# Patient Record
Sex: Female | Born: 1995
Health system: Southern US, Community
[De-identification: ages and names within clinical notes are randomized; demographics above are authoritative.]

## PROBLEM LIST (undated history)

## (undated) DIAGNOSIS — Z8371 Family history of colonic polyps: Secondary | ICD-10-CM

## (undated) HISTORY — PX: COLON SURGERY: SHX602

---
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
## 2015-05-11 DIAGNOSIS — N760 Acute vaginitis: Secondary | ICD-10-CM | POA: Diagnosis not present

## 2015-07-01 DIAGNOSIS — D481 Neoplasm of uncertain behavior of connective and other soft tissue: Secondary | ICD-10-CM | POA: Diagnosis not present

## 2015-07-25 DIAGNOSIS — K080 Exfoliation of teeth due to systemic causes: Secondary | ICD-10-CM | POA: Diagnosis not present

## 2015-08-04 DIAGNOSIS — K080 Exfoliation of teeth due to systemic causes: Secondary | ICD-10-CM | POA: Diagnosis not present

## 2015-08-16 DIAGNOSIS — D481 Neoplasm of uncertain behavior of connective and other soft tissue: Secondary | ICD-10-CM | POA: Diagnosis not present

## 2015-08-16 DIAGNOSIS — K921 Melena: Secondary | ICD-10-CM | POA: Diagnosis not present

## 2015-08-16 DIAGNOSIS — Z880 Allergy status to penicillin: Secondary | ICD-10-CM | POA: Diagnosis not present

## 2015-08-16 DIAGNOSIS — Z8711 Personal history of peptic ulcer disease: Secondary | ICD-10-CM | POA: Diagnosis not present

## 2015-08-19 DIAGNOSIS — K080 Exfoliation of teeth due to systemic causes: Secondary | ICD-10-CM | POA: Diagnosis not present

## 2015-10-07 IMAGING — US US ABDOMEN COMPLETE
1 series · 14 of 25 positions shown · non-contrast
Comparison: None.

CLINICAL DATA: Abdominal pain, history of prior colectomy

EXAM:
ULTRASOUND ABDOMEN COMPLETE

[Series 1: us abdomen complete · 0.20mm/px · 14 of 80 slices shown]
[im 1/80]
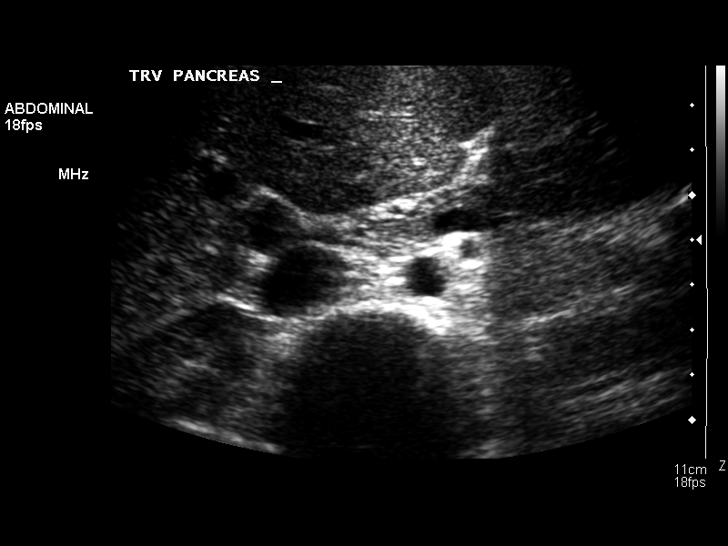
[im 7/80]
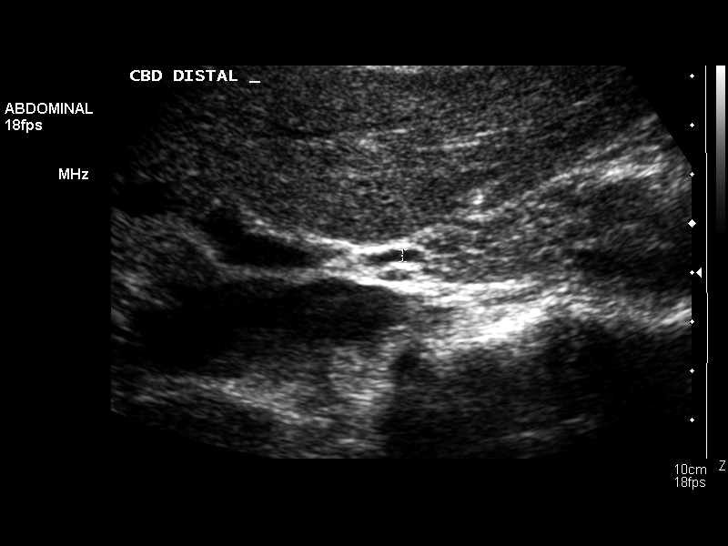
[im 14/80]
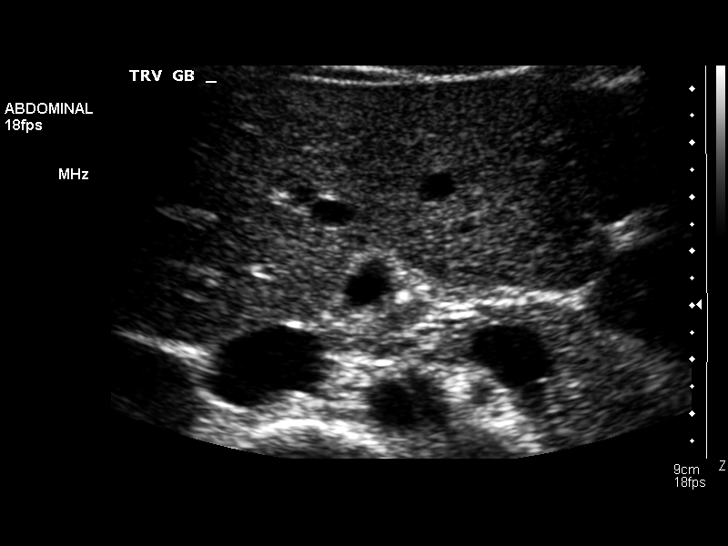
[im 20/80]
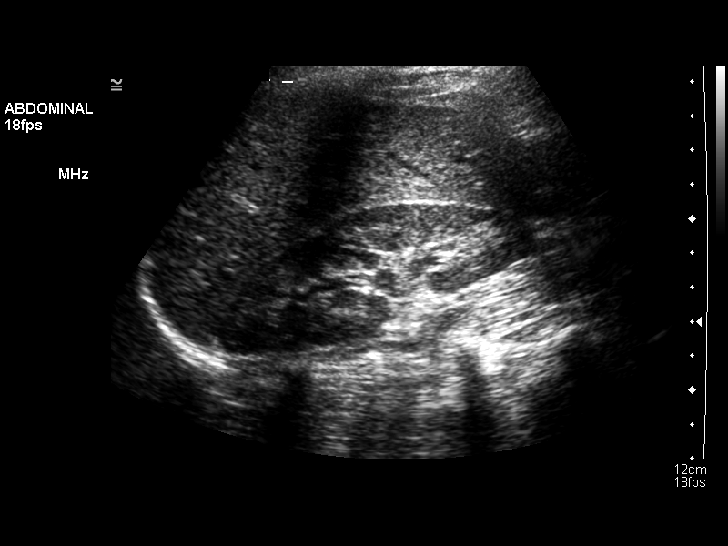
[im 27/80]
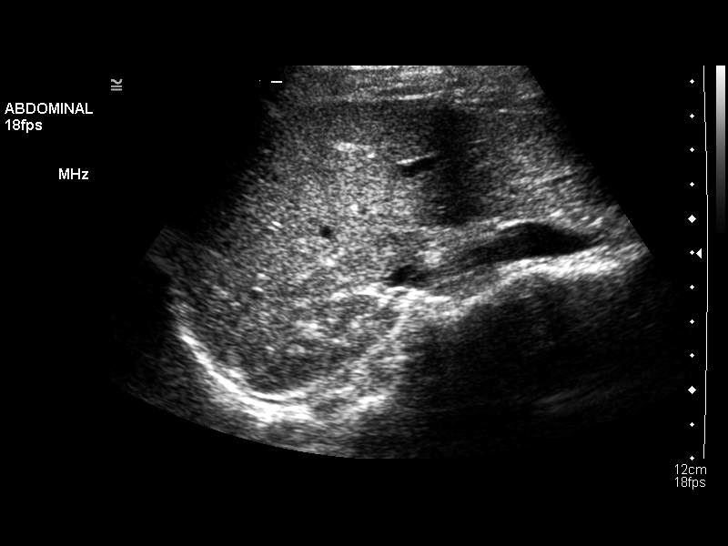
[im 30/80]
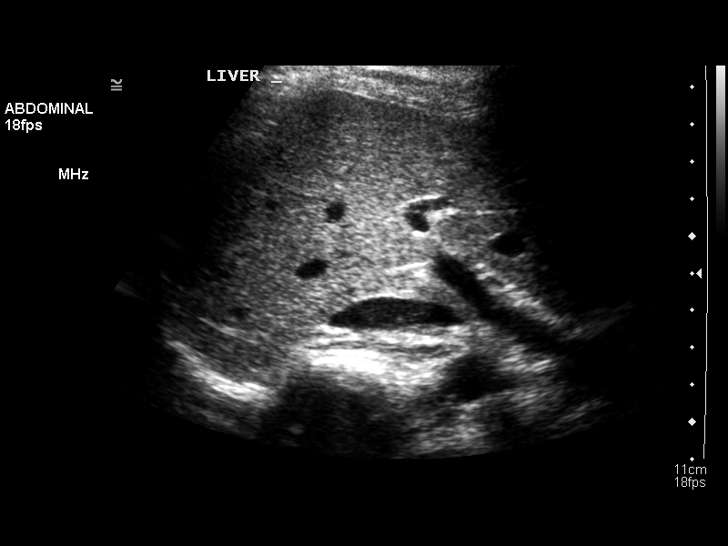
[im 37/80]
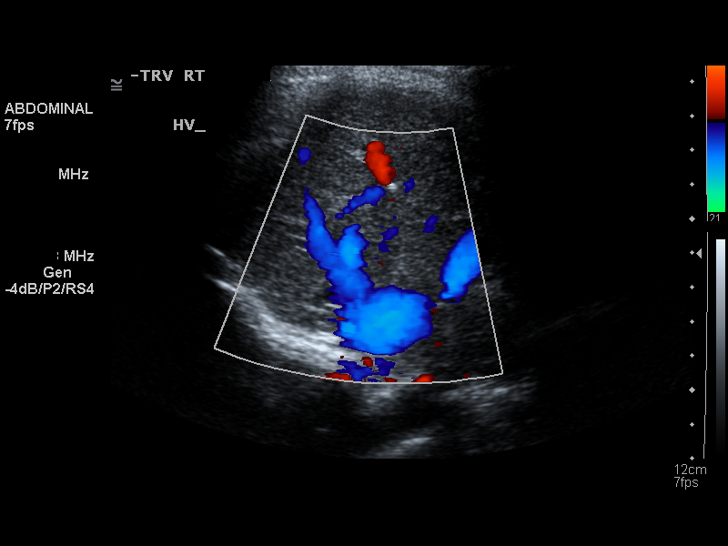
[im 43/80]
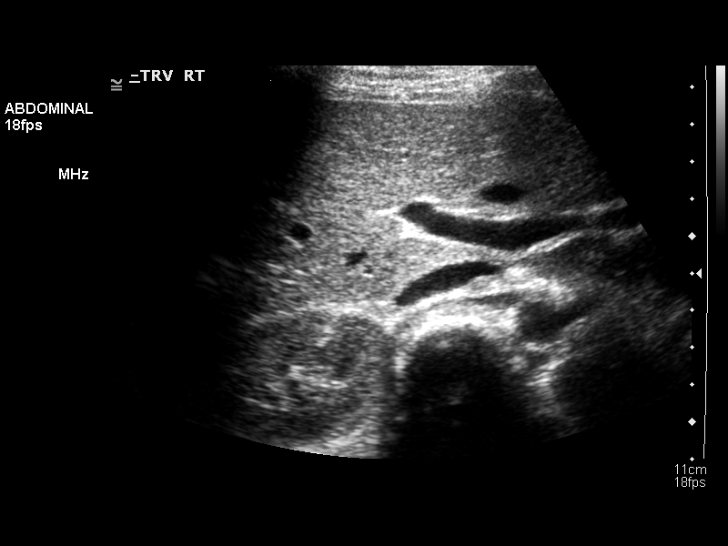
[im 50/80]
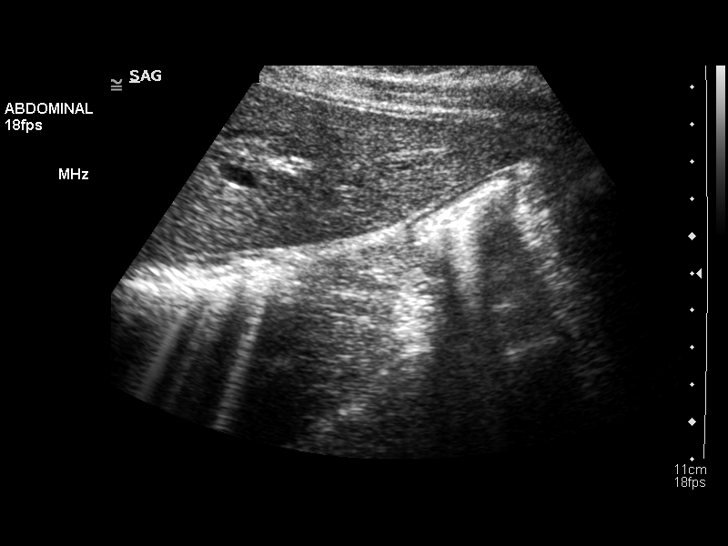
[im 53/80]
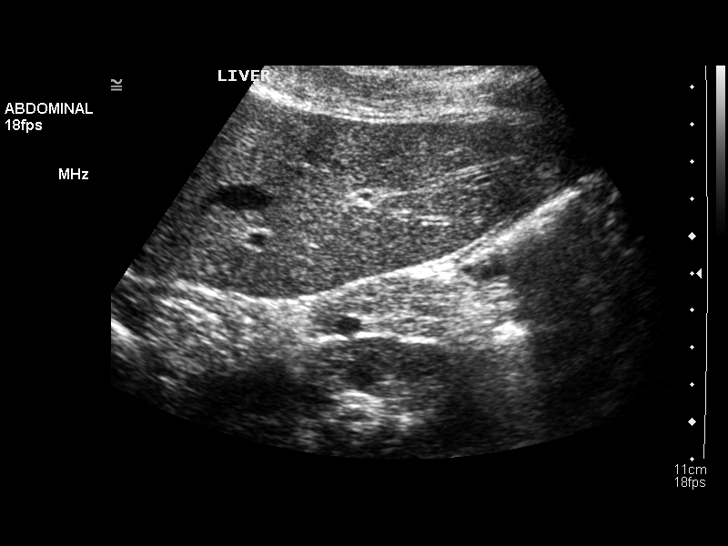
[im 60/80]
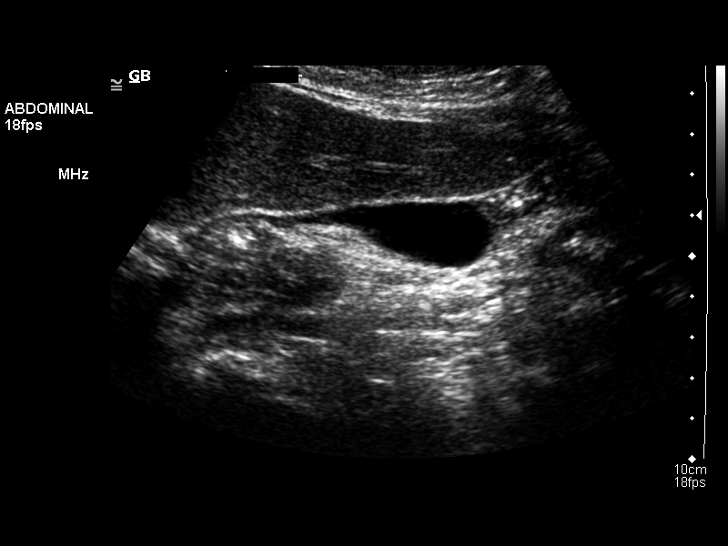
[im 66/80]
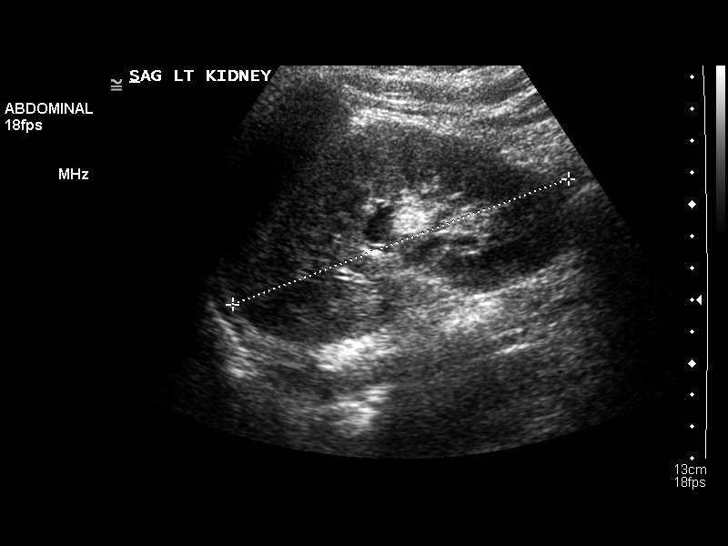
[im 73/80]
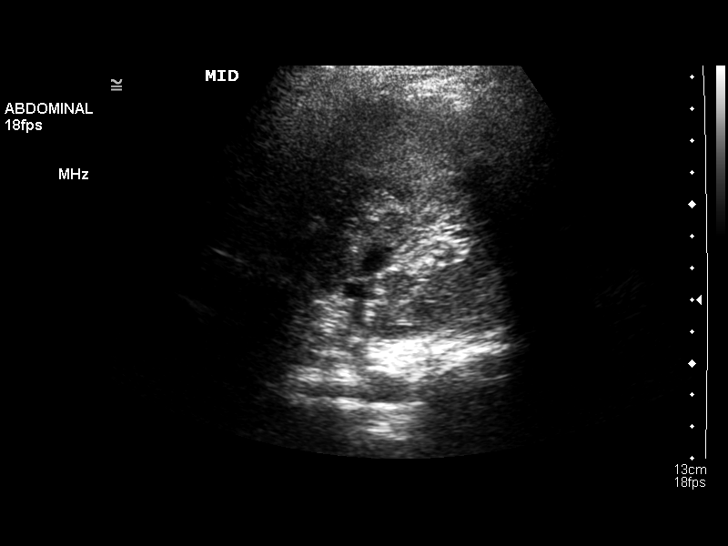
[im 80/80]
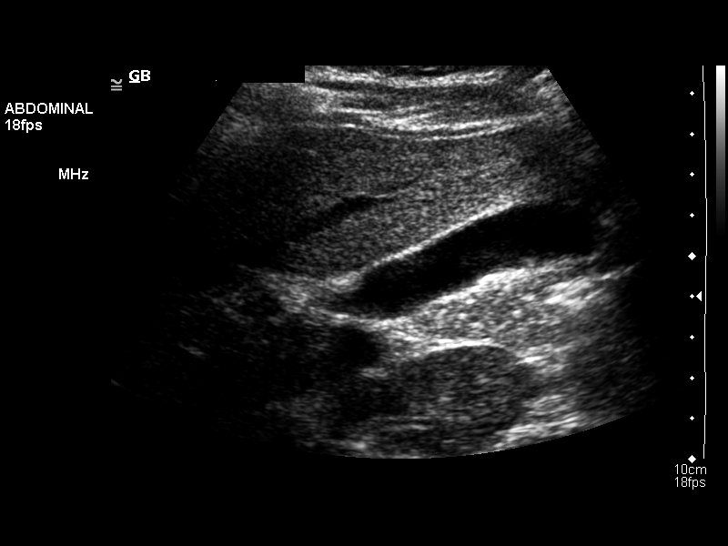

[14 of 25 positions shown; findings below may reference images not displayed]

FINDINGS: Gallbladder

The gallbladder is visualized and no gallstones are noted. There is
no pain over the gallbladder with compression.

Common bile duct

Diameter: The common bile duct is normal measuring 2.4 mm in
diameter distally.

Liver

The liver has a normal echogenic pattern. No focal abnormality is
seen.

IVC

No abnormality is visualized.

Pancreas

Visualized portion is unremarkable.

Spleen

The spleen is normal measuring 6.9 cm sagittally.

Right Kidney

Length: The right kidney measures 10.5 cm sagittally.. No
hydronephrosis is seen.

Left Kidney

Length: The left kidney measures 11.3 cm.. No hydronephrosis is
noted.

Abdominal aorta

The abdominal aorta is normal in caliber.
IMPRESSION: Negative abdominal ultrasound.

## 2015-10-24 DIAGNOSIS — Z6826 Body mass index (BMI) 26.0-26.9, adult: Secondary | ICD-10-CM | POA: Diagnosis not present

## 2015-10-24 DIAGNOSIS — Z01419 Encounter for gynecological examination (general) (routine) without abnormal findings: Secondary | ICD-10-CM | POA: Diagnosis not present

## 2015-11-16 DIAGNOSIS — Z23 Encounter for immunization: Secondary | ICD-10-CM | POA: Diagnosis not present

## 2015-12-07 DIAGNOSIS — Z113 Encounter for screening for infections with a predominantly sexual mode of transmission: Secondary | ICD-10-CM | POA: Diagnosis not present

## 2016-03-29 DIAGNOSIS — Z113 Encounter for screening for infections with a predominantly sexual mode of transmission: Secondary | ICD-10-CM | POA: Diagnosis not present

## 2016-04-03 DIAGNOSIS — M549 Dorsalgia, unspecified: Secondary | ICD-10-CM | POA: Diagnosis not present

## 2016-04-03 DIAGNOSIS — M25562 Pain in left knee: Secondary | ICD-10-CM | POA: Diagnosis not present

## 2016-04-03 DIAGNOSIS — M545 Low back pain: Secondary | ICD-10-CM | POA: Diagnosis not present

## 2016-05-24 DIAGNOSIS — Z862 Personal history of diseases of the blood and blood-forming organs and certain disorders involving the immune mechanism: Secondary | ICD-10-CM | POA: Diagnosis not present

## 2016-05-24 DIAGNOSIS — Z3046 Encounter for surveillance of implantable subdermal contraceptive: Secondary | ICD-10-CM | POA: Diagnosis not present

## 2016-05-24 DIAGNOSIS — Z13220 Encounter for screening for lipoid disorders: Secondary | ICD-10-CM | POA: Diagnosis not present

## 2016-05-24 DIAGNOSIS — Z23 Encounter for immunization: Secondary | ICD-10-CM | POA: Diagnosis not present

## 2016-05-24 DIAGNOSIS — Z131 Encounter for screening for diabetes mellitus: Secondary | ICD-10-CM | POA: Diagnosis not present

## 2016-05-24 DIAGNOSIS — Z0000 Encounter for general adult medical examination without abnormal findings: Secondary | ICD-10-CM | POA: Diagnosis not present

## 2016-05-24 DIAGNOSIS — Z30017 Encounter for initial prescription of implantable subdermal contraceptive: Secondary | ICD-10-CM | POA: Diagnosis not present

## 2016-06-08 DIAGNOSIS — K080 Exfoliation of teeth due to systemic causes: Secondary | ICD-10-CM | POA: Diagnosis not present

## 2016-07-13 DIAGNOSIS — Z23 Encounter for immunization: Secondary | ICD-10-CM | POA: Diagnosis not present

## 2016-08-14 DIAGNOSIS — Z23 Encounter for immunization: Secondary | ICD-10-CM | POA: Diagnosis not present

## 2016-08-21 DIAGNOSIS — H35413 Lattice degeneration of retina, bilateral: Secondary | ICD-10-CM | POA: Diagnosis not present

## 2016-08-21 DIAGNOSIS — Z0100 Encounter for examination of eyes and vision without abnormal findings: Secondary | ICD-10-CM | POA: Diagnosis not present

## 2016-10-12 DIAGNOSIS — D126 Benign neoplasm of colon, unspecified: Secondary | ICD-10-CM | POA: Diagnosis not present

## 2016-10-12 DIAGNOSIS — K314 Gastric diverticulum: Secondary | ICD-10-CM | POA: Diagnosis not present

## 2016-10-12 DIAGNOSIS — K6389 Other specified diseases of intestine: Secondary | ICD-10-CM | POA: Diagnosis not present

## 2016-10-12 DIAGNOSIS — Z09 Encounter for follow-up examination after completed treatment for conditions other than malignant neoplasm: Secondary | ICD-10-CM | POA: Diagnosis not present

## 2016-10-12 DIAGNOSIS — K635 Polyp of colon: Secondary | ICD-10-CM | POA: Diagnosis not present

## 2016-10-19 DIAGNOSIS — J069 Acute upper respiratory infection, unspecified: Secondary | ICD-10-CM | POA: Diagnosis not present

## 2016-11-27 DIAGNOSIS — Z1159 Encounter for screening for other viral diseases: Secondary | ICD-10-CM | POA: Diagnosis not present

## 2016-11-27 DIAGNOSIS — Z01419 Encounter for gynecological examination (general) (routine) without abnormal findings: Secondary | ICD-10-CM | POA: Diagnosis not present

## 2016-11-27 DIAGNOSIS — Z114 Encounter for screening for human immunodeficiency virus [HIV]: Secondary | ICD-10-CM | POA: Diagnosis not present

## 2016-11-27 DIAGNOSIS — Z6826 Body mass index (BMI) 26.0-26.9, adult: Secondary | ICD-10-CM | POA: Diagnosis not present

## 2016-11-27 DIAGNOSIS — Z113 Encounter for screening for infections with a predominantly sexual mode of transmission: Secondary | ICD-10-CM | POA: Diagnosis not present

## 2016-11-30 DIAGNOSIS — Z862 Personal history of diseases of the blood and blood-forming organs and certain disorders involving the immune mechanism: Secondary | ICD-10-CM | POA: Diagnosis not present

## 2016-11-30 DIAGNOSIS — E785 Hyperlipidemia, unspecified: Secondary | ICD-10-CM | POA: Diagnosis not present

## 2016-12-01 DIAGNOSIS — Z111 Encounter for screening for respiratory tuberculosis: Secondary | ICD-10-CM | POA: Diagnosis not present

## 2016-12-01 DIAGNOSIS — Z0000 Encounter for general adult medical examination without abnormal findings: Secondary | ICD-10-CM | POA: Diagnosis not present

## 2016-12-27 DIAGNOSIS — K080 Exfoliation of teeth due to systemic causes: Secondary | ICD-10-CM | POA: Diagnosis not present

## 2017-01-18 ENCOUNTER — Encounter (HOSPITAL_COMMUNITY): Payer: Self-pay

## 2017-01-18 ENCOUNTER — Other Ambulatory Visit: Payer: Self-pay

## 2017-01-18 DIAGNOSIS — Z79899 Other long term (current) drug therapy: Secondary | ICD-10-CM

## 2017-01-18 DIAGNOSIS — R141 Gas pain: Secondary | ICD-10-CM

## 2017-01-18 DIAGNOSIS — R140 Abdominal distension (gaseous): Secondary | ICD-10-CM | POA: Diagnosis not present

## 2017-01-18 DIAGNOSIS — R1012 Left upper quadrant pain: Secondary | ICD-10-CM | POA: Insufficient documentation

## 2017-01-18 DIAGNOSIS — R112 Nausea with vomiting, unspecified: Secondary | ICD-10-CM | POA: Insufficient documentation

## 2017-01-18 DIAGNOSIS — R1013 Epigastric pain: Secondary | ICD-10-CM | POA: Diagnosis not present

## 2017-01-18 LAB — CBC
HEMATOCRIT: 41 % (ref 36.0–46.0)
Hemoglobin: 13.3 g/dL (ref 12.0–15.0)
MCH: 26 pg (ref 26.0–34.0)
MCHC: 32.4 g/dL (ref 30.0–36.0)
MCV: 80.1 fL (ref 78.0–100.0)
PLATELETS: 296 10*3/uL (ref 150–400)
RBC: 5.12 MIL/uL — AB (ref 3.87–5.11)
RDW: 13.2 % (ref 11.5–15.5)
WBC: 7.3 10*3/uL (ref 4.0–10.5)

## 2017-01-18 LAB — COMPREHENSIVE METABOLIC PANEL
ALT: 21 U/L (ref 14–54)
AST: 23 U/L (ref 15–41)
Albumin: 4.4 g/dL (ref 3.5–5.0)
Alkaline Phosphatase: 86 U/L (ref 38–126)
Anion gap: 10 (ref 5–15)
BILIRUBIN TOTAL: 0.7 mg/dL (ref 0.3–1.2)
BUN: 9 mg/dL (ref 6–20)
CO2: 24 mmol/L (ref 22–32)
Calcium: 9.5 mg/dL (ref 8.9–10.3)
Chloride: 101 mmol/L (ref 101–111)
Creatinine, Ser: 0.69 mg/dL (ref 0.44–1.00)
GFR calc Af Amer: >60 mL/min (ref >60)
GFR calc non Af Amer: >60 mL/min (ref >60)
Glucose, Bld: 110 mg/dL — ABNORMAL HIGH (ref 65–99)
Potassium: 4.1 mmol/L (ref 3.5–5.1)
Sodium: 135 mmol/L (ref 135–145)
TOTAL PROTEIN: 8.3 g/dL — AB (ref 6.5–8.1)

## 2017-01-18 LAB — URINALYSIS, ROUTINE W REFLEX MICROSCOPIC
Bilirubin Urine: NEGATIVE
GLUCOSE, UA: NEGATIVE mg/dL
Ketones, ur: NEGATIVE mg/dL
LEUKOCYTES UA: NEGATIVE
NITRITE: NEGATIVE
Protein, ur: 30 mg/dL — AB
SPECIFIC GRAVITY, URINE: 1.021 (ref 1.005–1.030)
pH: 7 (ref 5.0–8.0)

## 2017-01-18 LAB — I-STAT BETA HCG BLOOD, ED (MC, WL, AP ONLY): I-stat hCG, quantitative: <5 m[IU]/mL (ref <5)

## 2017-01-18 LAB — LIPASE, BLOOD: Lipase: 38 U/L (ref 11–51)

## 2017-01-18 MED ORDER — IBUPROFEN 400 MG PO TABS
400.0000 mg | ORAL_TABLET | Freq: Once | ORAL | Status: AC | PRN
  Administered 2017-01-18: 400 mg via ORAL
  Filled 2017-01-18: qty 1

## 2017-01-18 NOTE — ED Triage Notes (Signed)
Pt requesting something for pain.  

## 2017-01-18 NOTE — ED Triage Notes (Signed)
Pt states that for the past two hours has had LUQ abd pain , denies n/v/d/fevers. Pt took some gas x with some relief

## 2017-01-19 ENCOUNTER — Emergency Department (HOSPITAL_BASED_OUTPATIENT_CLINIC_OR_DEPARTMENT_OTHER): Payer: Federal, State, Local not specified - PPO

## 2017-01-19 ENCOUNTER — Emergency Department (HOSPITAL_BASED_OUTPATIENT_CLINIC_OR_DEPARTMENT_OTHER)
Admission: EM | Admit: 2017-01-19 | Discharge: 2017-01-19 | Disposition: A | Discharge status: Home / Self Care | Payer: Federal, State, Local not specified - PPO | Attending: Emergency Medicine | Admitting: Emergency Medicine

## 2017-01-19 ENCOUNTER — Other Ambulatory Visit: Payer: Self-pay

## 2017-01-19 ENCOUNTER — Encounter (HOSPITAL_BASED_OUTPATIENT_CLINIC_OR_DEPARTMENT_OTHER): Payer: Self-pay | Admitting: Emergency Medicine

## 2017-01-19 ENCOUNTER — Emergency Department (HOSPITAL_COMMUNITY)
Admission: EM | Admit: 2017-01-19 | Discharge: 2017-01-19 | Disposition: A | Discharge status: Home / Self Care | Payer: Federal, State, Local not specified - PPO | Source: Home / Self Care | Attending: Emergency Medicine | Admitting: Emergency Medicine

## 2017-01-19 DIAGNOSIS — R1013 Epigastric pain: Secondary | ICD-10-CM | POA: Diagnosis not present

## 2017-01-19 DIAGNOSIS — R1012 Left upper quadrant pain: Secondary | ICD-10-CM

## 2017-01-19 DIAGNOSIS — R141 Gas pain: Secondary | ICD-10-CM

## 2017-01-19 DIAGNOSIS — R112 Nausea with vomiting, unspecified: Secondary | ICD-10-CM | POA: Diagnosis not present

## 2017-01-19 LAB — CBC WITH DIFFERENTIAL/PLATELET
BASOS ABS: 0 10*3/uL (ref 0.0–0.1)
BASOS PCT: 0 %
Eosinophils Absolute: 0 10*3/uL (ref 0.0–0.7)
Eosinophils Relative: 0 %
HCT: 38.3 % (ref 36.0–46.0)
HEMOGLOBIN: 12.7 g/dL (ref 12.0–15.0)
Lymphocytes Relative: 19 %
Lymphs Abs: 1.4 10*3/uL (ref 0.7–4.0)
MCH: 26.1 pg (ref 26.0–34.0)
MCHC: 33.2 g/dL (ref 30.0–36.0)
MCV: 78.8 fL (ref 78.0–100.0)
Monocytes Absolute: 0.6 10*3/uL (ref 0.1–1.0)
Monocytes Relative: 8 %
Neutro Abs: 5.1 10*3/uL (ref 1.7–7.7)
Neutrophils Relative %: 73 %
Platelets: 271 10*3/uL (ref 150–400)
RBC: 4.86 MIL/uL (ref 3.87–5.11)
RDW: 13.3 % (ref 11.5–15.5)
WBC: 7.1 10*3/uL (ref 4.0–10.5)

## 2017-01-19 LAB — COMPREHENSIVE METABOLIC PANEL
ALK PHOS: 82 U/L (ref 38–126)
ALT: 19 U/L (ref 14–54)
AST: 21 U/L (ref 15–41)
Albumin: 4.3 g/dL (ref 3.5–5.0)
Anion gap: 8 (ref 5–15)
BUN: 10 mg/dL (ref 6–20)
CALCIUM: 9.3 mg/dL (ref 8.9–10.3)
CO2: 25 mmol/L (ref 22–32)
CREATININE: 0.78 mg/dL (ref 0.44–1.00)
Chloride: 101 mmol/L (ref 101–111)
Glucose, Bld: 96 mg/dL (ref 65–99)
Potassium: 4 mmol/L (ref 3.5–5.1)
Sodium: 134 mmol/L — ABNORMAL LOW (ref 135–145)
TOTAL PROTEIN: 8.1 g/dL (ref 6.5–8.1)
Total Bilirubin: 0.6 mg/dL (ref 0.3–1.2)

## 2017-01-19 LAB — LIPASE, BLOOD: Lipase: 28 U/L (ref 11–51)

## 2017-01-19 MED ORDER — SUCRALFATE 1 GM/10ML PO SUSP: 1.0000 g | Freq: Three times a day (TID) | ORAL | 0 refills | Status: AC

## 2017-01-19 MED ORDER — DICYCLOMINE HCL 20 MG PO TABS: 20.0000 mg | ORAL_TABLET | Freq: Two times a day (BID) | ORAL | 0 refills | Status: AC

## 2017-01-19 MED ORDER — OMEPRAZOLE 20 MG PO CPDR: 20.0000 mg | DELAYED_RELEASE_CAPSULE | Freq: Every day | ORAL | 0 refills | Status: AC

## 2017-01-19 MED ORDER — ONDANSETRON HCL 4 MG/2ML IJ SOLN
4.0000 mg | Freq: Once | INTRAMUSCULAR | Status: AC
  Administered 2017-01-19: 4 mg via INTRAVENOUS
  Filled 2017-01-19: qty 2

## 2017-01-19 MED ORDER — ONDANSETRON 8 MG PO TBDP: 4.0000 mg | ORAL_TABLET | Freq: Three times a day (TID) | ORAL | 0 refills | Status: DC | PRN

## 2017-01-19 MED ORDER — SODIUM CHLORIDE 0.9 % IV BOLUS (SEPSIS)
1000.0000 mL | Freq: Once | INTRAVENOUS | Status: AC
  Administered 2017-01-19: 1000 mL via INTRAVENOUS

## 2017-01-19 MED ORDER — FAMOTIDINE IN NACL 20-0.9 MG/50ML-% IV SOLN
20.0000 mg | Freq: Once | INTRAVENOUS | Status: AC
  Administered 2017-01-19: 20 mg via INTRAVENOUS
  Filled 2017-01-19: qty 50

## 2017-01-19 MED ORDER — PANTOPRAZOLE SODIUM 40 MG IV SOLR
40.0000 mg | Freq: Once | INTRAVENOUS | Status: AC
  Administered 2017-01-19: 40 mg via INTRAVENOUS
  Filled 2017-01-19: qty 40

## 2017-01-19 MED ORDER — ONDANSETRON 4 MG PO TBDP: 4.0000 mg | ORAL_TABLET | Freq: Three times a day (TID) | ORAL | 0 refills | Status: AC | PRN

## 2017-01-19 MED ORDER — OMEPRAZOLE 20 MG PO CPDR: 20.0000 mg | DELAYED_RELEASE_CAPSULE | Freq: Every day | ORAL | 0 refills | Status: DC

## 2017-01-19 MED ORDER — DICYCLOMINE HCL 10 MG/ML IM SOLN
20.0000 mg | Freq: Once | INTRAMUSCULAR | Status: AC
  Administered 2017-01-19: 20 mg via INTRAMUSCULAR
  Filled 2017-01-19: qty 2

## 2017-01-19 NOTE — Discharge Instructions (Signed)
Can continue gas-x as needed.  May also with to try carbonated beverage (like soda) and moving around to try to help as well. Follow-up with your primary care doctor. Can return here for any new/acute changes.

## 2017-01-19 NOTE — ED Provider Notes (Signed)
[facility] EMERGENCY DEPARTMENT Provider Note   CSN: [id number] Arrival date & time: 01/18/17  2213     History   Chief Complaint Chief Complaint  Patient presents with  . Abdominal Pain    HPI Lisa Duffy is a 22 y.o. female.  The history is provided by the patient and medical records.  Abdominal Pain       22 year old female with no significant past medical history presenting to the ED with left upper abdominal pain.  Reports this began prior to eating dinner this evening around 8 PM.  States pain was sharp in nature, almost like a bloating sensation as well.  States when she ate her pain worsen.  She did take some Gas-X which seemed to help her pain but still remains somewhat severe so she wanted to be evaluated.  She has not had any nausea, vomiting, or diarrhea.  No fever or chills.  No recent illness.  She did not try any other medications for her symptoms.  Past Medical History:  Diagnosis Date  . Family history of FAP (familial adenomatous polyposis)     There are no active problems to display for this patient.   Past Surgical History:  Procedure Laterality Date  . COLON SURGERY      OB History    No data available       Home Medications    Prior to Admission medications   Medication Sig Start Date End Date Taking? Authorizing Provider  BIOTIN PO Take 1 tablet by mouth daily.    [provider]  docusate sodium (COLACE) 100 MG capsule Take 1 capsule (100 mg total) by mouth every 12 (twelve) hours. 12/11/14   Azalia Bilis, MD  etonogestrel (NEXPLANON) 68 MG IMPL implant 1 each by Subdermal route once.    [provider]  ferrous fumarate (HEMOCYTE - 106 MG FE) 325 (106 FE) MG TABS tablet Take 1 tablet by mouth every other day.    [provider]  HYDROcodone-acetaminophen (NORCO/VICODIN) 5-325 MG tablet Take 1 tablet by mouth every 4 (four) hours as needed for moderate pain. 12/11/14   Azalia Bilis, MD    promethazine (PHENERGAN) 25 MG tablet Take 25 mg by mouth every 6 (six) hours as needed for nausea or vomiting.    [provider]    Family History No family history on file.  Social History Social History   Tobacco Use  . Smoking status: Never Smoker  . Smokeless tobacco: Never Used  Substance Use Topics  . Alcohol use: No  . Drug use: No     Allergies   Amoxicillin and Penicillins   Review of Systems Review of Systems  Gastrointestinal: Positive for abdominal pain.  All other systems reviewed and are negative.    Physical Exam Updated Vital Signs BP (!) 120/50 (BP Location: Right Arm)   Pulse 77   Temp 98.8 F (37.1 C) (Oral)   Resp 18   SpO2 96%   Physical Exam  Constitutional: She is oriented to person, place, and time. She appears well-developed and well-nourished.  Sleeping soundly on stretcher, had to be awoken for exam  HENT:  Head: Normocephalic and atraumatic.  Mouth/Throat: Oropharynx is clear and moist.  Eyes: Conjunctivae and EOM are normal. Pupils are equal, round, and reactive to light.  Neck: Normal range of motion.  Cardiovascular: Normal rate, regular rhythm and normal heart sounds.  Pulmonary/Chest: Effort normal and breath sounds normal.  Abdominal: Soft. Bowel sounds are normal.  There is no tenderness. There is no rebound.  Soft, nontender, normal bowel sounds  Musculoskeletal: Normal range of motion.  Neurological: She is alert and oriented to person, place, and time.  Skin: Skin is warm and dry.  Psychiatric: She has a normal mood and affect.  Nursing note and vitals reviewed.    ED Treatments / Results  Labs (all labs ordered are listed, but only abnormal results are displayed) Labs Reviewed  COMPREHENSIVE METABOLIC PANEL - Abnormal; Notable for the following components:      Result Value   Glucose, Bld 110 (*)    Total Protein 8.3 (*)    All other components within normal limits  CBC - Abnormal; Notable for the  following components:   RBC 5.12 (*)    All other components within normal limits  URINALYSIS, ROUTINE W REFLEX MICROSCOPIC - Abnormal; Notable for the following components:   APPearance HAZY (*)    Hgb urine dipstick SMALL (*)    Protein, ur 30 (*)    Bacteria, UA RARE (*)    Squamous Epithelial / LPF 0-5 (*)    All other components within normal limits  LIPASE, BLOOD  I-STAT BETA HCG BLOOD, ED ([facility], [facility], [facility] ONLY)    EKG  EKG Interpretation None       Radiology No results found.  Procedures Procedures (including critical care time)  Medications Ordered in ED Medications  ibuprofen (ADVIL,MOTRIN) tablet 400 mg (400 mg Oral Given 01/18/17 2305)     Initial Impression / Assessment and Plan / ED Course  I have reviewed the triage vital signs and the nursing notes.  Pertinent labs & imaging results that were available during my care of the patient were reviewed by me and considered in my medical decision making (see chart for details).  22 year old female here with left upper abdominal pain.  Seems to have subsided at this time as she is sleeping soundly on the exam stretcher.  Her abdomen is soft and benign.  Screening labs overall reassuring.  Suspect she may have been suffering from some gas pain.  Recommended to continue Gas-X as needed.  May also try carbonated beverage and walking around to help as well.  Can follow-up with PCP.  Discussed plan with patient, she acknowledged understanding and agreed with plan of care.  Return precautions given for new or worsening symptoms.  Final Clinical Impressions(s) / ED Diagnoses   Final diagnoses:  Gas pain    ED Discharge Orders    None       Garlon HatchetSanders, Kinney Sackmann M, PA-C 01/19/17 0440    Gilda CreasePollina, Christopher J, MD 01/19/17 [phone]

## 2017-01-19 NOTE — Discharge Instructions (Signed)
Your abdominal pain is likely from gastritis, reflux or a stomach ulcer. You will need to take the prescribed proton pump inhibitor as directed, and avoid spicy/fatty/acidic foods. Avoid laying down flat within 30 minutes of eating. Avoid NSAIDs like ibuprofen or Aleve on an empty stomach. Use zofran as needed for nausea. Follow up with the gastroenterologist (GI doctor) listed for ongoing evaluation of your abdominal pain. Return to the ER for new or worsening symptoms, any additional concers. ° ° °SEEK IMMEDIATE MEDICAL ATTENTION IF YOU DEVELOP ANY OF THE FOLLOWING SYMPTOMS: °The pain does not go away or becomes severe.  °A temperature above 101 develops.  °Repeated vomiting occurs (multiple episodes).  °Blood is being passed in stools or vomit (bright red or black tarry stools).  °Return also if you develop chest pain, difficulty breathing, dizziness or fainting ° °

## 2017-01-19 NOTE — ED Notes (Signed)
Patient transported to Ultrasound 

## 2017-01-19 NOTE — ED Triage Notes (Signed)
Pt c/o abd pain since yesterday, seen at [facility], vomiting started today.

## 2017-01-19 NOTE — ED Provider Notes (Signed)
[facility] EMERGENCY DEPARTMENT Provider Note   CSN: [id number] Arrival date & time: 01/19/17  1041     History   Chief Complaint Chief Complaint  Patient presents with  . Abdominal Pain    HPI Lisa Duffy is a 22 y.o. female.  HPI 22 year old African American female past medical history significant for familial adenomatosis polyposis who has a history of a partial colectomy 5 years ago that presents to the emergency department today with complaints of left upper quadrant abdominal pain.  The patient was seen in ED yesterday for the same.  Patient states that the pain began prior to eating dinner yesterday evening around 8 PM.  She reports the pain was sharp in nature and feels like a bloating sensation.  She states that when she ate her pain worsen.  Patient thought that it may be some gas and took a Gas-X without any relief.  Patient was seen in the ED and was diagnosed with gas pains total follow-up with primary care doctor.  The patient states the day the pain has continued intermittently but she also reports 3 episodes of emesis today with associated nausea.  Patient denies any change in bowel habits.  Food does not make the pain worse today.  She describes the pain as a burning sensation in her left upper quadrant.  Patient denies any associated urinary symptoms, vaginal symptoms, fevers, chills.  She does report recent travel approximately 1 month ago to [place].  Denies any recent antibiotic use.  Patient denies any bloody stools.  Nothing makes her symptoms better or worse.  Pt denies any fever, chill, ha, vision changes, lightheadedness, dizziness, congestion, neck pain, cp, sob, cough, urinary symptoms, change in bowel habits, melena, hematochezia, lower extremity paresthesias, vaginal bleeding or discharge.  Past Medical History:  Diagnosis Date  . Family history of FAP (familial adenomatous polyposis)     There are no active problems to display for this  patient.   Past Surgical History:  Procedure Laterality Date  . COLON SURGERY      OB History    No data available       Home Medications    Prior to Admission medications   Medication Sig Start Date End Date Taking? Authorizing Provider  BIOTIN PO Take 1 tablet by mouth daily.    [provider]  dicyclomine (BENTYL) 20 MG tablet Take 1 tablet (20 mg total) by mouth 2 (two) times daily. 01/19/17   Rise Mu, PA-C  docusate sodium (COLACE) 100 MG capsule Take 1 capsule (100 mg total) by mouth every 12 (twelve) hours. 12/11/14   Azalia Bilis, MD  etonogestrel (NEXPLANON) 68 MG IMPL implant 1 each by Subdermal route once.    [provider]  ferrous fumarate (HEMOCYTE - 106 MG FE) 325 (106 FE) MG TABS tablet Take 1 tablet by mouth every other day.    [provider]  HYDROcodone-acetaminophen (NORCO/VICODIN) 5-325 MG tablet Take 1 tablet by mouth every 4 (four) hours as needed for moderate pain. 12/11/14   Azalia Bilis, MD  omeprazole (PRILOSEC) 20 MG capsule Take 1 capsule (20 mg total) by mouth daily. 01/19/17   Demetrios Loll T, PA-C  ondansetron (ZOFRAN-ODT) 4 MG disintegrating tablet Take 1 tablet (4 mg total) by mouth every 8 (eight) hours as needed for nausea. 01/19/17   Rise Mu, PA-C  promethazine (PHENERGAN) 25 MG tablet Take 25 mg by mouth every 6 (six) hours as needed for nausea or vomiting.  [provider]  sucralfate (CARAFATE) 1 GM/10ML suspension Take 10 mLs (1 g total) by mouth 4 (four) times daily -  with meals and at bedtime. 01/19/17   Rise MuLeaphart, Kenneth T, PA-C    Family History No family history on file.  Social History Social History   Tobacco Use  . Smoking status: Never Smoker  . Smokeless tobacco: Never Used  Substance Use Topics  . Alcohol use: No  . Drug use: No     Allergies   Amoxicillin and Penicillins   Review of Systems Review of Systems  Constitutional: Negative for chills and  fever.  HENT: Negative for congestion.   Eyes: Negative for visual disturbance.  Respiratory: Negative for cough and shortness of breath.   Cardiovascular: Negative for chest pain.  Gastrointestinal: Positive for abdominal pain, nausea and vomiting. Negative for diarrhea.  Genitourinary: Negative for dysuria, flank pain, frequency, hematuria, urgency, vaginal bleeding and vaginal discharge.  Musculoskeletal: Negative for arthralgias and myalgias.  Skin: Negative for rash.  Neurological: Negative for dizziness, syncope, weakness, light-headedness, numbness and headaches.  Psychiatric/Behavioral: Negative for sleep disturbance. The patient is not nervous/anxious.      Physical Exam Updated Vital Signs BP 116/67 (BP Location: Left Arm)   Pulse 68   Temp 98.3 F (36.8 C) (Oral)   Resp 16   Ht 5\' 3"  (1.6 m)   Wt 64.4 kg (142 lb)   LMP 01/15/2017   SpO2 100%   BMI 25.15 kg/m   Physical Exam  Constitutional: She is oriented to person, place, and time. She appears well-developed and well-nourished.  Non-toxic appearance. No distress.  HENT:  Head: Normocephalic and atraumatic.  Nose: Nose normal.  Mouth/Throat: Oropharynx is clear and moist.  Eyes: Conjunctivae are normal. Pupils are equal, round, and reactive to light. Right eye exhibits no discharge. Left eye exhibits no discharge.  Neck: Normal range of motion. Neck supple.  Cardiovascular: Normal rate, regular rhythm, normal heart sounds and intact distal pulses.  Pulmonary/Chest: Effort normal and breath sounds normal. No stridor. No respiratory distress. She has no wheezes. She has no rales. She exhibits no tenderness.  Abdominal: Soft. Bowel sounds are normal. There is tenderness in the epigastric area and left upper quadrant. There is no rigidity, no rebound, no guarding, no CVA tenderness, no tenderness at McBurney's point and negative Murphy's sign.  Musculoskeletal: Normal range of motion. She exhibits no tenderness.    Lymphadenopathy:    She has no cervical adenopathy.  Neurological: She is alert and oriented to person, place, and time.  Skin: Skin is warm and dry. Capillary refill takes less than 2 seconds.  Psychiatric: Her behavior is normal. Judgment and thought content normal.  Nursing note and vitals reviewed.    ED Treatments / Results  Labs (all labs ordered are listed, but only abnormal results are displayed) Labs Reviewed  COMPREHENSIVE METABOLIC PANEL - Abnormal; Notable for the following components:      Result Value   Sodium 134 (*)    All other components within normal limits  CBC WITH DIFFERENTIAL/PLATELET  LIPASE, BLOOD    EKG  EKG Interpretation None       Radiology [place] Abdomen Complete  Result Date: 01/19/2017 CLINICAL DATA:  Left upper quadrant abdominal pain for the past 2 days. Vomiting. EXAM: ABDOMEN ULTRASOUND COMPLETE COMPARISON:  CT abdomen pelvis - 12/11/2014 FINDINGS: Gallbladder: Normal sonographic appearance of the gallbladder. No gallbladder wall thickening or pericholecystic fluid. Negative sonographic Murphy's sign. Common bile duct: Diameter: Normal  in size measuring 2 mm in diameter Liver: Normal sonographic appearance of the liver. No discrete hepatic lesions. No intra or extrahepatic biliary dilatation. No ascites. Portal vein is patent on color Doppler imaging with normal direction of blood flow towards the liver. IVC: No abnormality visualized. Pancreas: Limited visualization of the pancreatic head and neck is normal. Visualization of the pancreatic body and tail is obscured by bowel gas. Spleen: Normal in size measuring 6.7 cm in length Right Kidney: Normal cortical thickness, echogenicity and size, measuring 10.6 cm in length. No focal renal lesions. No echogenic renal stones. No urinary obstruction. Left Kidney: Normal cortical thickness, echogenicity and size, measuring 10.9 cm in length. No focal renal lesions. No echogenic renal stones. No urinary  obstruction. Abdominal aorta: No aneurysm visualized. Other findings: None. IMPRESSION: No explanation patient's left upper quadrant abdominal pain and vomiting. Specifically, no evidence of cholelithiasis/cholecystitis or urinary obstruction. Electronically Signed   By: Simonne Come M.D.   On: 01/19/2017 14:39    Procedures Procedures (including critical care time)  Medications Ordered in ED Medications  sodium chloride 0.9 % bolus 1,000 mL (0 mLs Intravenous Stopped 01/19/17 1538)  famotidine (PEPCID) IVPB 20 mg premix (0 mg Intravenous Stopped 01/19/17 1429)  pantoprazole (PROTONIX) injection 40 mg (40 mg Intravenous Given 01/19/17 1356)  ondansetron (ZOFRAN) injection 4 mg (4 mg Intravenous Given 01/19/17 1356)  dicyclomine (BENTYL) injection 20 mg (20 mg Intramuscular Given 01/19/17 1401)     Initial Impression / Assessment and Plan / ED Course  I have reviewed the triage vital signs and the nursing notes.  Pertinent labs & imaging results that were available during my care of the patient were reviewed by me and considered in my medical decision making (see chart for details).     Patient is nontoxic, nonseptic appearing, in no apparent distress.  Patient's pain and other symptoms adequately managed in emergency department.  Fluid bolus given.  Labs, imaging and vitals reviewed.  Patient had normal urine and negative pregnancy test yesterday.  Patient given GI cocktail, Pepcid and Protonix and felt much improved on reassessment.  Ultrasound was obtained that shows no acute abnormalities.  Patient has normal lipase low suspicion for pancreatitis.  Discussed CT scan with parents and patient and they would like to avoid at this time.  Patient does not meet the SIRS or Sepsis criteria.  On repeat exam patient does not have a surgical abdomin and there are no peritoneal signs.  No indication of appendicitis, bowel obstruction, bowel perforation, cholecystitis, diverticulitis, PID or ectopic pregnancy.   Patient symptoms seem consistent with possible gastritis.  Patient discharged home with symptomatic treatment and given strict instructions for follow-up with their primary care physician.  I have also discussed reasons to return immediately to the ER.    Pt is hemodynamically stable, in NAD, & able to ambulate in the ED. Evaluation does not show pathology that would require ongoing emergent intervention or inpatient treatment. I explained the diagnosis to the patient. Pain has been managed & has no complaints prior to dc. Pt is comfortable with above plan and is stable for discharge at this time. All questions were answered prior to disposition. Strict return precautions for f/u to the ED were discussed. Encouraged follow up with PCP.      Final Clinical Impressions(s) / ED Diagnoses   Final diagnoses:  LUQ pain    ED Discharge Orders        Ordered    sucralfate (CARAFATE) 1 GM/10ML suspension  3 times daily with meals & bedtime     01/19/17 1530    dicyclomine (BENTYL) 20 MG tablet  2 times daily     01/19/17 1530    omeprazole (PRILOSEC) 20 MG capsule  Daily,   Status:  Discontinued     01/19/17 1530    ondansetron (ZOFRAN-ODT) 8 MG disintegrating tablet  Every 8 hours PRN,   Status:  Discontinued     01/19/17 1530    ondansetron (ZOFRAN-ODT) 4 MG disintegrating tablet  Every 8 hours PRN     01/19/17 1531    omeprazole (PRILOSEC) 20 MG capsule  Daily     01/19/17 1531       Rise Mu, PA-C 01/19/17 1718    Gwyneth Sprout, MD 01/19/17 2155

## 2017-01-19 NOTE — ED Notes (Signed)
Pt on auto VS q30 

## 2017-01-25 DIAGNOSIS — Z23 Encounter for immunization: Secondary | ICD-10-CM | POA: Diagnosis not present

## 2017-03-25 DIAGNOSIS — K080 Exfoliation of teeth due to systemic causes: Secondary | ICD-10-CM | POA: Diagnosis not present

## 2017-03-25 DIAGNOSIS — Z118 Encounter for screening for other infectious and parasitic diseases: Secondary | ICD-10-CM | POA: Diagnosis not present

## 2017-03-25 DIAGNOSIS — Z113 Encounter for screening for infections with a predominantly sexual mode of transmission: Secondary | ICD-10-CM | POA: Diagnosis not present

## 2017-08-07 DIAGNOSIS — L0292 Furuncle, unspecified: Secondary | ICD-10-CM | POA: Diagnosis not present

## 2017-08-23 DIAGNOSIS — Z3046 Encounter for surveillance of implantable subdermal contraceptive: Secondary | ICD-10-CM | POA: Diagnosis not present

## 2017-10-14 DIAGNOSIS — K080 Exfoliation of teeth due to systemic causes: Secondary | ICD-10-CM | POA: Diagnosis not present

## 2017-10-28 DIAGNOSIS — Z1212 Encounter for screening for malignant neoplasm of rectum: Secondary | ICD-10-CM | POA: Diagnosis not present

## 2017-10-28 DIAGNOSIS — K635 Polyp of colon: Secondary | ICD-10-CM | POA: Diagnosis not present

## 2017-10-28 DIAGNOSIS — D1339 Benign neoplasm of other parts of small intestine: Secondary | ICD-10-CM | POA: Diagnosis not present

## 2017-10-28 DIAGNOSIS — D126 Benign neoplasm of colon, unspecified: Secondary | ICD-10-CM | POA: Diagnosis not present

## 2017-10-28 DIAGNOSIS — K5140 Inflammatory polyps of colon without complications: Secondary | ICD-10-CM | POA: Diagnosis not present

## 2017-10-28 DIAGNOSIS — Z86010 Personal history of colonic polyps: Secondary | ICD-10-CM | POA: Diagnosis not present

## 2017-10-28 DIAGNOSIS — K621 Rectal polyp: Secondary | ICD-10-CM | POA: Diagnosis not present

## 2017-10-28 DIAGNOSIS — K317 Polyp of stomach and duodenum: Secondary | ICD-10-CM | POA: Diagnosis not present

## 2017-10-29 IMAGING — CT CT ABD-PELV W/ CM
2 of 4 series · 16 of 46 positions shown, 18 images · IV contrast (omnipaque)
Comparison: Ultrasound pelvis 11/18/2012

CLINICAL DATA: Colectomy in 9740. Now with right lower quadrant
pain, nausea and vomiting, and fever.

EXAM:
CT ABDOMEN AND PELVIS WITH CONTRAST
TECHNIQUE: Multidetector CT imaging of the abdomen and pelvis was performed
using the standard protocol following bolus administration of
intravenous contrast.
CONTRAST:  75mL OMNIPAQUE IOHEXOL 300 MG/ML  SOLN

[Series 3: a/p w/ 5mm · axial · 0.67mm/px · z∈[+659,+1044]mm · 13 of 85 slices shown, 15 images]
[im 4/85  soft-tissue]
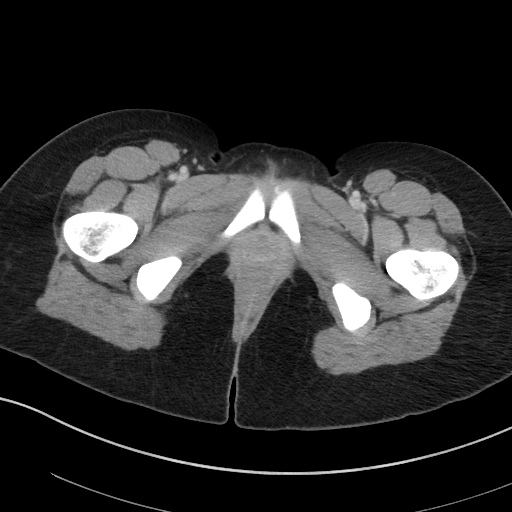
[im 4/85  bone]
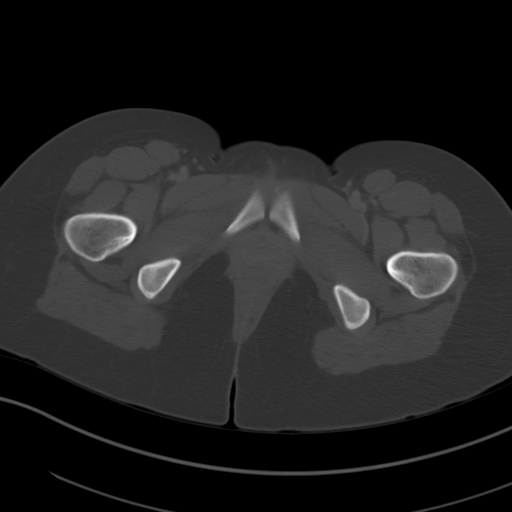
[im 10/85  soft-tissue]
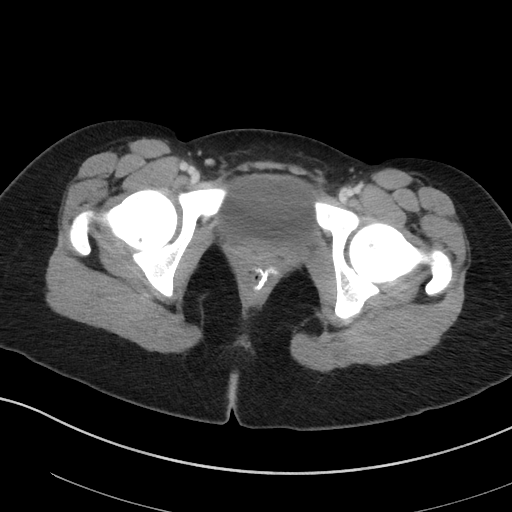
[im 17/85  soft-tissue]
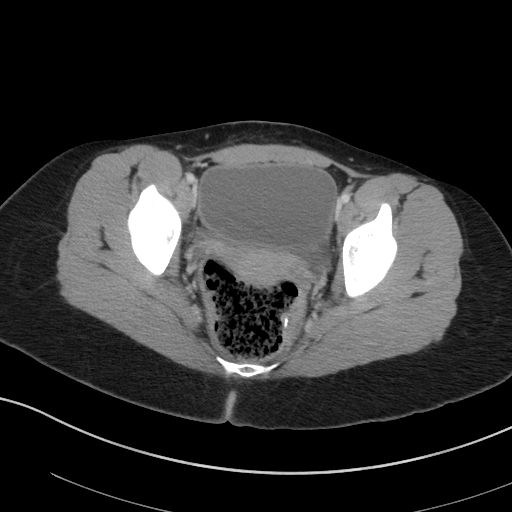
[im 23/85  soft-tissue]
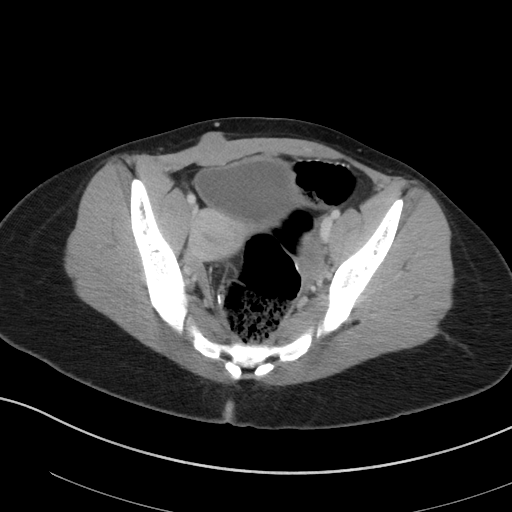
[im 30/85  soft-tissue]
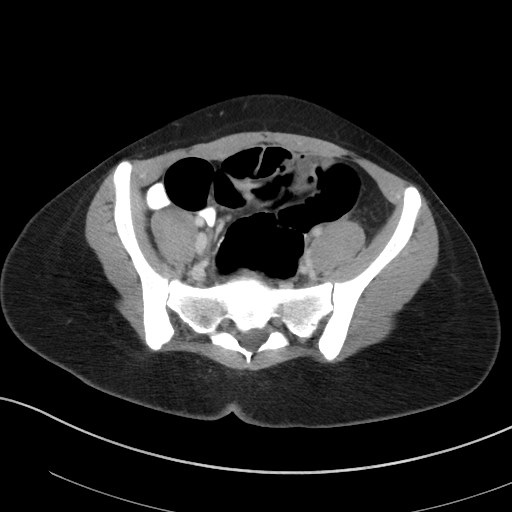
[im 36/85  soft-tissue]
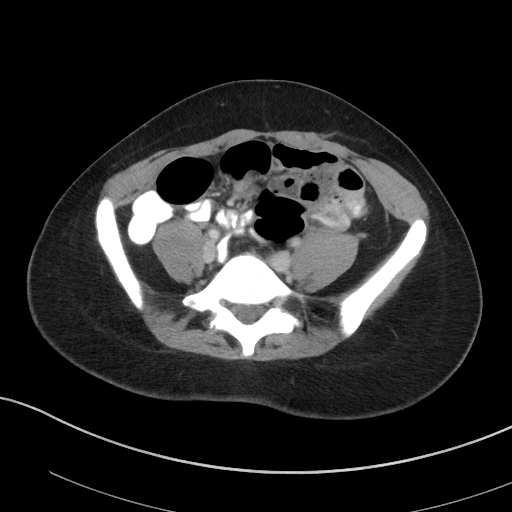
[im 43/85  soft-tissue]
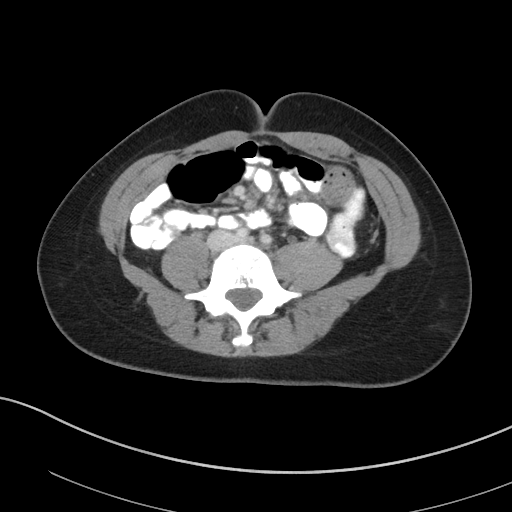
[im 49/85  soft-tissue]
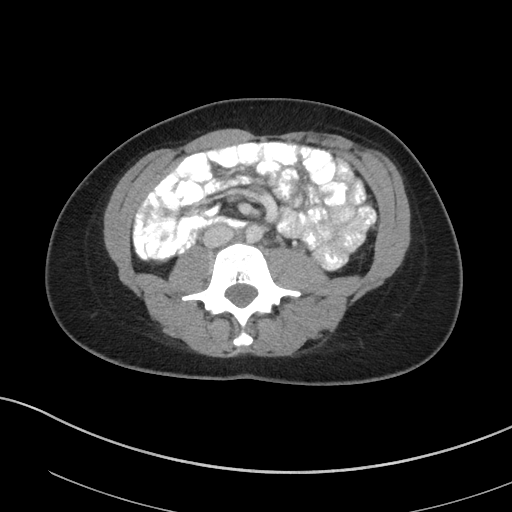
[im 55/85  soft-tissue]
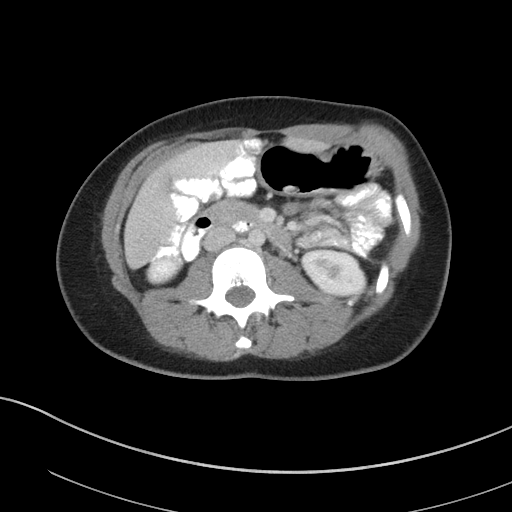
[im 55/85  bone]
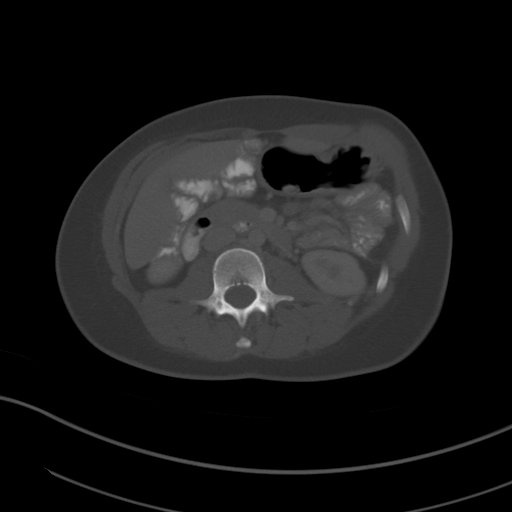
[im 62/85  soft-tissue]
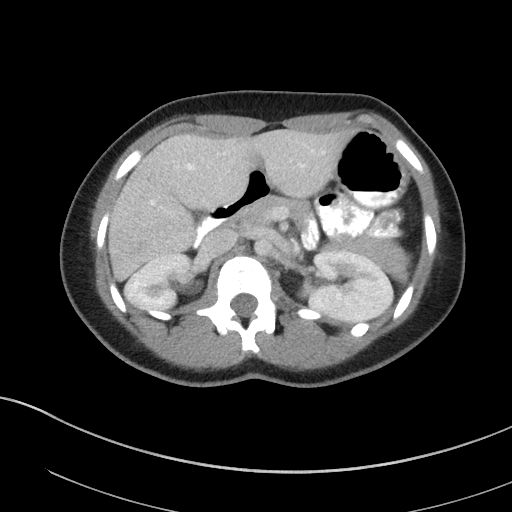
[im 68/85  soft-tissue]
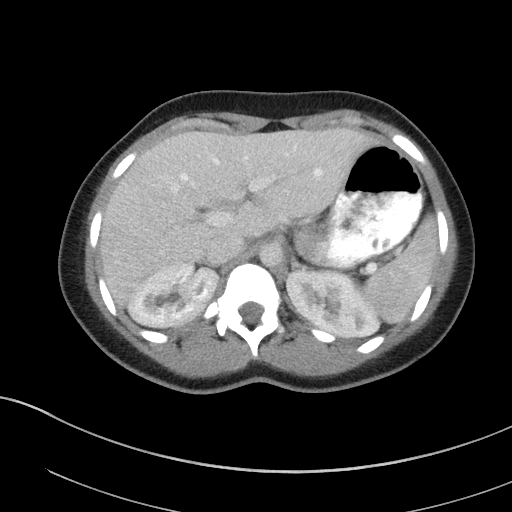
[im 75/85  soft-tissue]
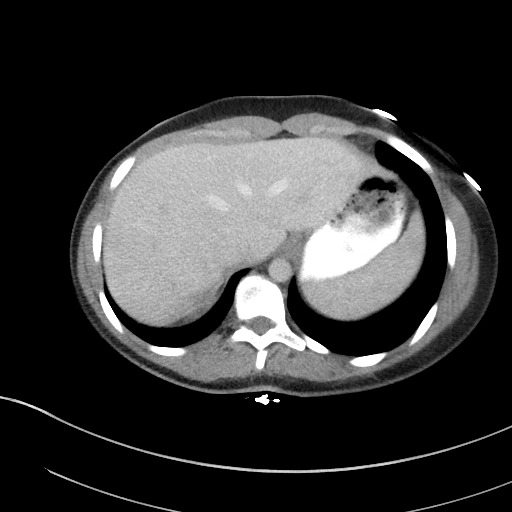
[im 81/85  soft-tissue]
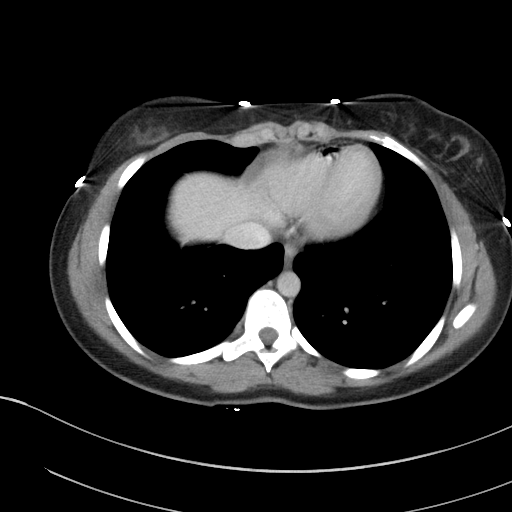

[Series 6: a/p w/ cor · coronal · 0.69mm/px · 3 of 94 slices shown]
[im 32/94  soft-tissue]
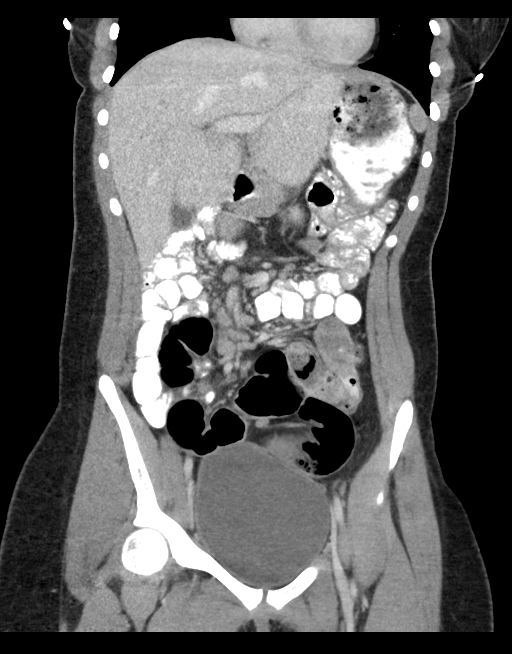
[im 42/94  soft-tissue]
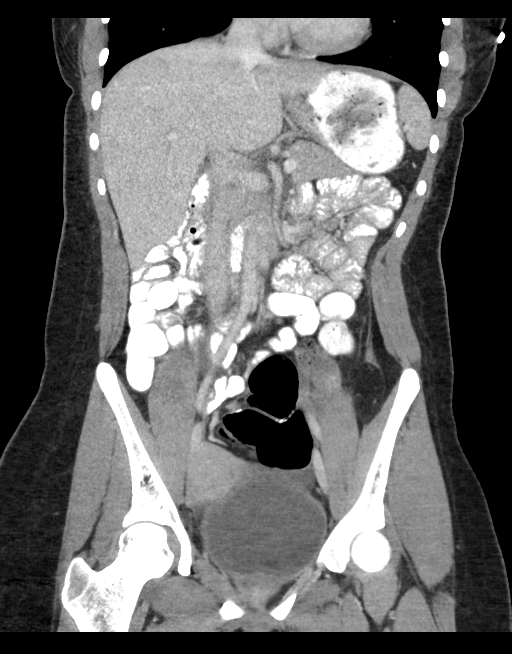
[im 52/94  soft-tissue]
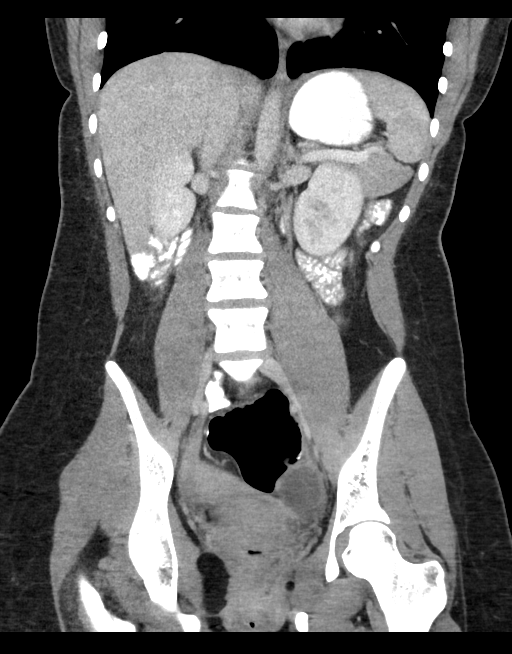

[16 of 46 positions shown; findings below may reference images not displayed]

FINDINGS: The lung bases are clear. There is gas in the right ventricle,
probably related to IV injections.

The liver, spleen, gallbladder, pancreas, adrenal glands, abdominal
aorta, inferior vena cava, and retroperitoneal lymph nodes are
unremarkable. Renal nephrograms demonstrate somewhat prominent
increased attenuation at the corticomedullary junction possibly
representing normal variation due to the phase of contrast
injection. However, this could also represent calcification
suggesting medullary sponge kidney. No discrete stones identified.
No hydronephrosis or hydroureter. Stomach and small bowel are not
abnormally distended. Colonic resection with Hartmann's pouch
formation. Mild prominence of mesenteric lymph nodes, likely
reactive. No free air or free fluid in the abdomen. Abdominal wall
musculature appears intact.

Pelvis: Bladder wall is not thickened. Left ovarian cyst measuring
3.7 cm maximal dimension. This is likely physiologic in a
premenopausal patient and no further imaging is warranted. Uterus is
not enlarged. No free or loculated pelvic fluid collections. No
pelvic lymphadenopathy. With no destructive bone lesions.
IMPRESSION: No bowel obstruction or inflammation. Equivocal finding in the
kidneys as described may represent normal variation. Medullary
sponge kidney or other cause of medullary calcinosis not excluded.

## 2017-12-09 DIAGNOSIS — Z114 Encounter for screening for human immunodeficiency virus [HIV]: Secondary | ICD-10-CM | POA: Diagnosis not present

## 2017-12-09 DIAGNOSIS — Z118 Encounter for screening for other infectious and parasitic diseases: Secondary | ICD-10-CM | POA: Diagnosis not present

## 2017-12-09 DIAGNOSIS — Z13220 Encounter for screening for lipoid disorders: Secondary | ICD-10-CM | POA: Diagnosis not present

## 2017-12-09 DIAGNOSIS — Z01419 Encounter for gynecological examination (general) (routine) without abnormal findings: Secondary | ICD-10-CM | POA: Diagnosis not present

## 2017-12-09 DIAGNOSIS — Z1151 Encounter for screening for human papillomavirus (HPV): Secondary | ICD-10-CM | POA: Diagnosis not present

## 2017-12-09 DIAGNOSIS — Z113 Encounter for screening for infections with a predominantly sexual mode of transmission: Secondary | ICD-10-CM | POA: Diagnosis not present

## 2017-12-09 DIAGNOSIS — Z1159 Encounter for screening for other viral diseases: Secondary | ICD-10-CM | POA: Diagnosis not present

## 2017-12-09 DIAGNOSIS — Z6826 Body mass index (BMI) 26.0-26.9, adult: Secondary | ICD-10-CM | POA: Diagnosis not present

## 2017-12-23 DIAGNOSIS — D132 Benign neoplasm of duodenum: Secondary | ICD-10-CM | POA: Diagnosis not present

## 2017-12-23 DIAGNOSIS — K317 Polyp of stomach and duodenum: Secondary | ICD-10-CM | POA: Diagnosis not present

## 2017-12-23 DIAGNOSIS — D124 Benign neoplasm of descending colon: Secondary | ICD-10-CM | POA: Diagnosis not present

## 2017-12-23 DIAGNOSIS — Z8719 Personal history of other diseases of the digestive system: Secondary | ICD-10-CM | POA: Diagnosis not present

## 2018-01-09 DIAGNOSIS — Z0000 Encounter for general adult medical examination without abnormal findings: Secondary | ICD-10-CM | POA: Diagnosis not present

## 2018-01-21 DIAGNOSIS — M25561 Pain in right knee: Secondary | ICD-10-CM | POA: Diagnosis not present

## 2018-04-02 DIAGNOSIS — S39012A Strain of muscle, fascia and tendon of lower back, initial encounter: Secondary | ICD-10-CM | POA: Diagnosis not present

## 2018-05-16 DIAGNOSIS — M25561 Pain in right knee: Secondary | ICD-10-CM | POA: Diagnosis not present

## 2018-05-28 DIAGNOSIS — M25561 Pain in right knee: Secondary | ICD-10-CM | POA: Diagnosis not present

## 2018-05-28 DIAGNOSIS — M5441 Lumbago with sciatica, right side: Secondary | ICD-10-CM | POA: Diagnosis not present

## 2018-05-28 DIAGNOSIS — G8929 Other chronic pain: Secondary | ICD-10-CM | POA: Diagnosis not present

## 2018-06-11 DIAGNOSIS — M25561 Pain in right knee: Secondary | ICD-10-CM | POA: Diagnosis not present

## 2018-06-11 DIAGNOSIS — G8929 Other chronic pain: Secondary | ICD-10-CM | POA: Diagnosis not present

## 2018-06-11 DIAGNOSIS — M5441 Lumbago with sciatica, right side: Secondary | ICD-10-CM | POA: Diagnosis not present

## 2018-07-02 DIAGNOSIS — F4320 Adjustment disorder, unspecified: Secondary | ICD-10-CM | POA: Diagnosis not present

## 2018-07-08 DIAGNOSIS — F4320 Adjustment disorder, unspecified: Secondary | ICD-10-CM | POA: Diagnosis not present

## 2018-07-25 DIAGNOSIS — F4320 Adjustment disorder, unspecified: Secondary | ICD-10-CM | POA: Diagnosis not present

## 2018-07-30 DIAGNOSIS — F4320 Adjustment disorder, unspecified: Secondary | ICD-10-CM | POA: Diagnosis not present

## 2018-08-02 IMAGING — US US ABDOMEN COMPLETE
1 series · 13 of 25 positions shown · non-contrast
Comparison: CT abdomen pelvis - 12/11/2014

CLINICAL DATA: Left upper quadrant abdominal pain for the past 2
days. Vomiting.

EXAM:
ABDOMEN ULTRASOUND COMPLETE

[Series 1: us abdomen complete · 0.15mm/px · 13 of 64 slices shown]
[im 1/64]
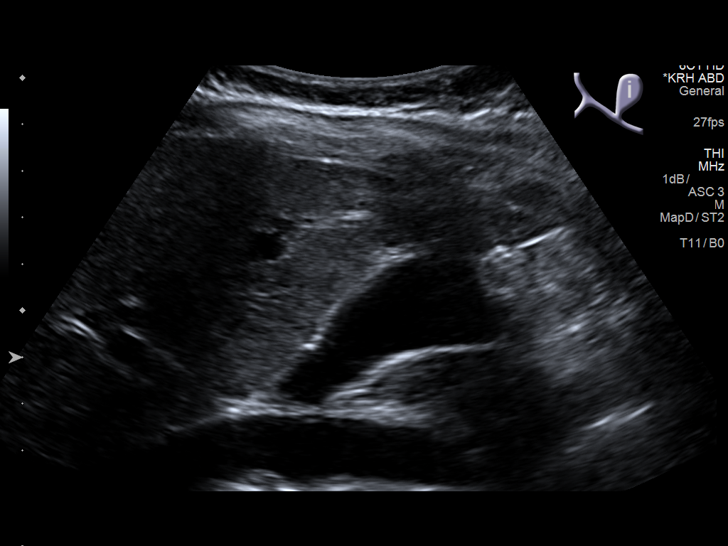
[im 6/64]
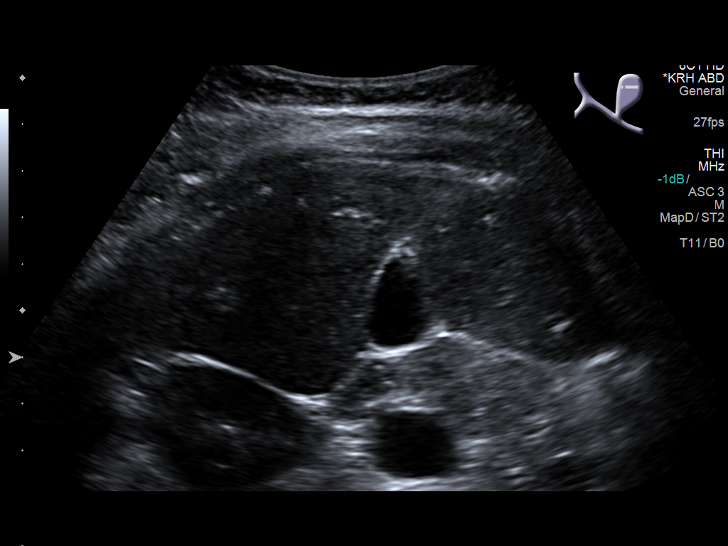
[im 11/64]
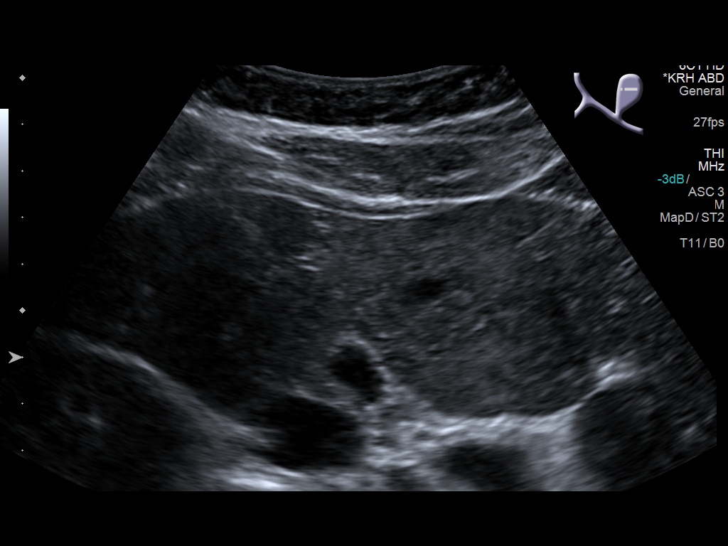
[im 16/64]
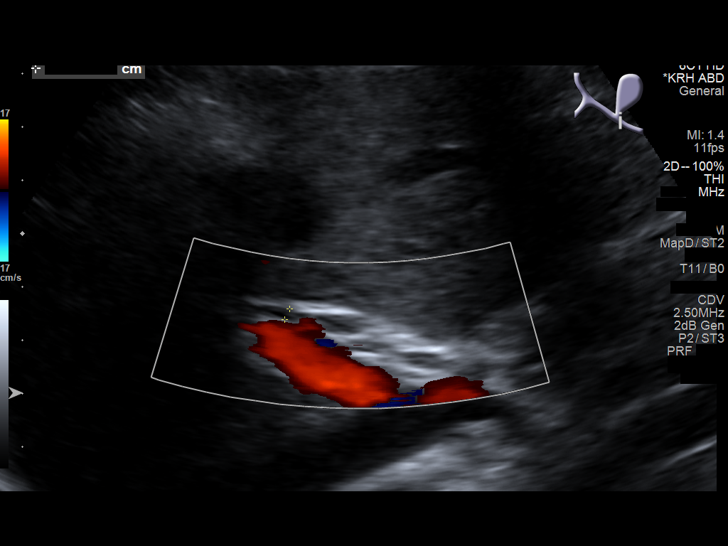
[im 22/64]
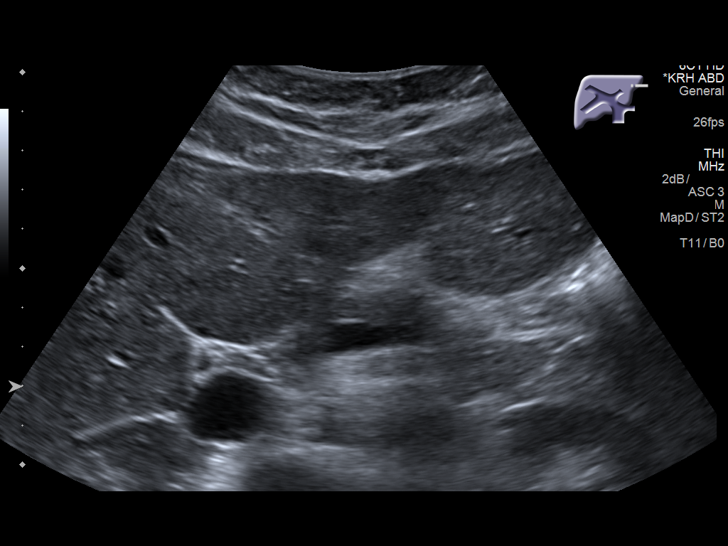
[im 27/64]
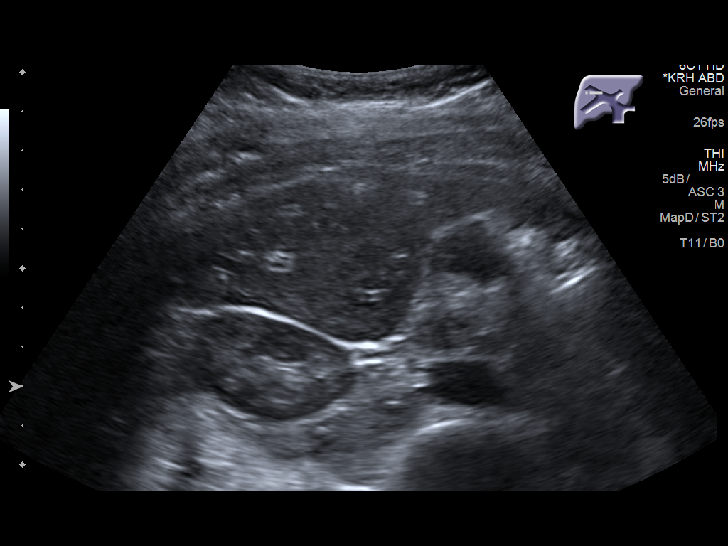
[im 32/64]
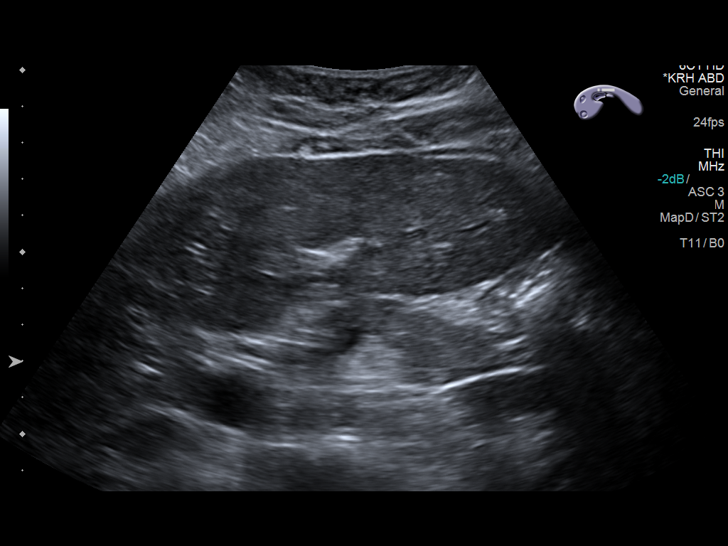
[im 37/64]
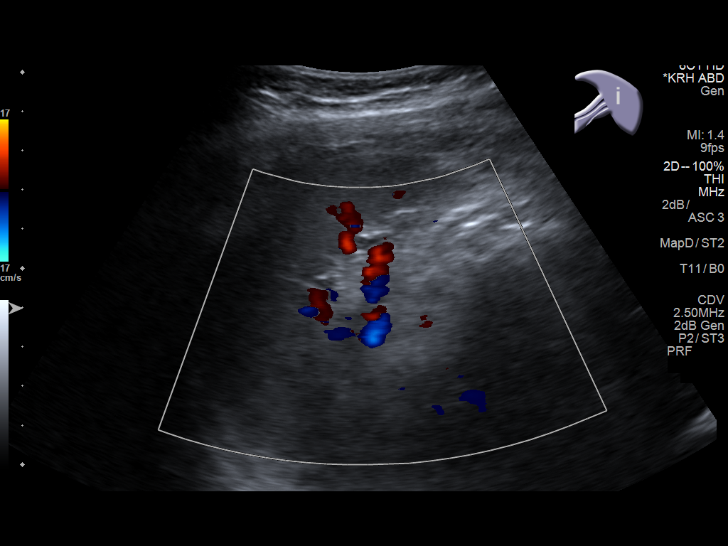
[im 43/64]
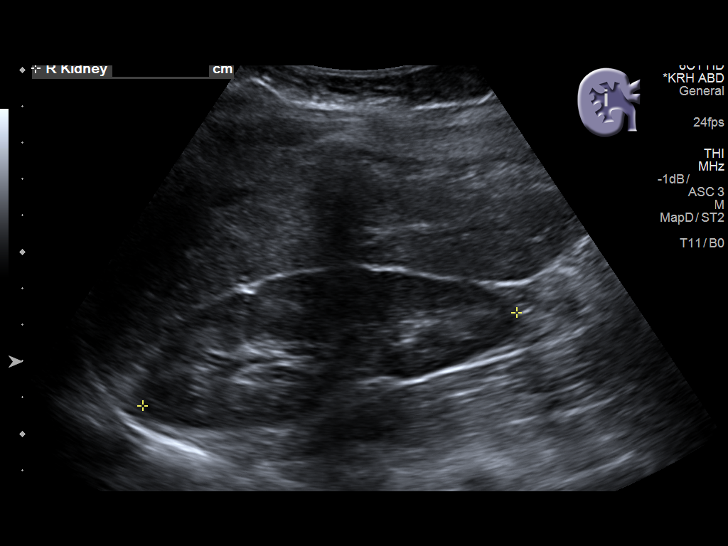
[im 48/64]
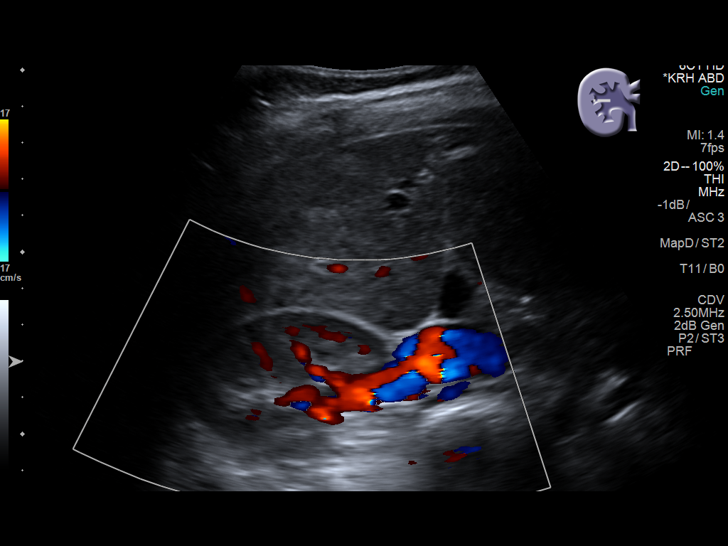
[im 53/64]
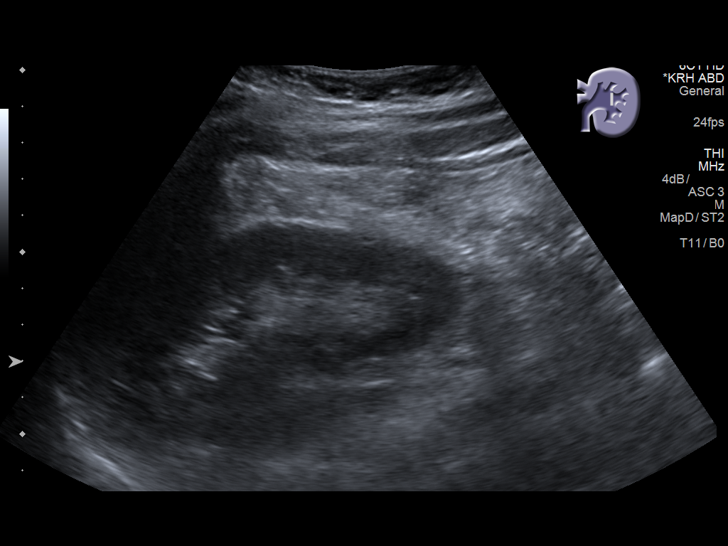
[im 58/64]
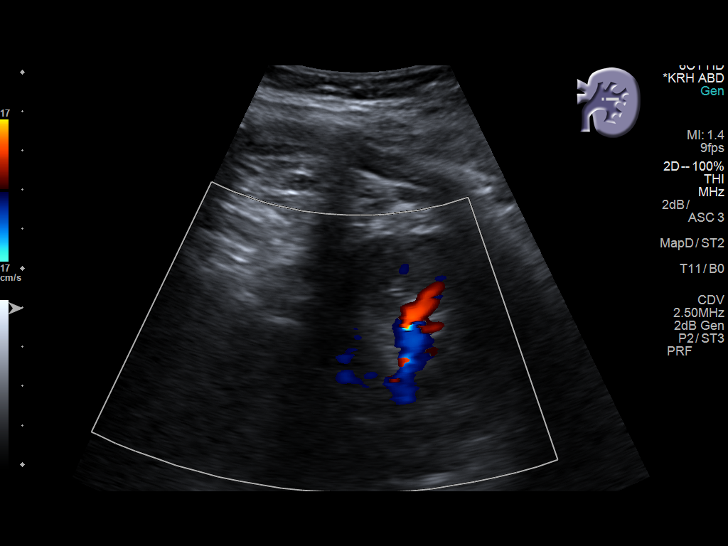
[im 64/64]
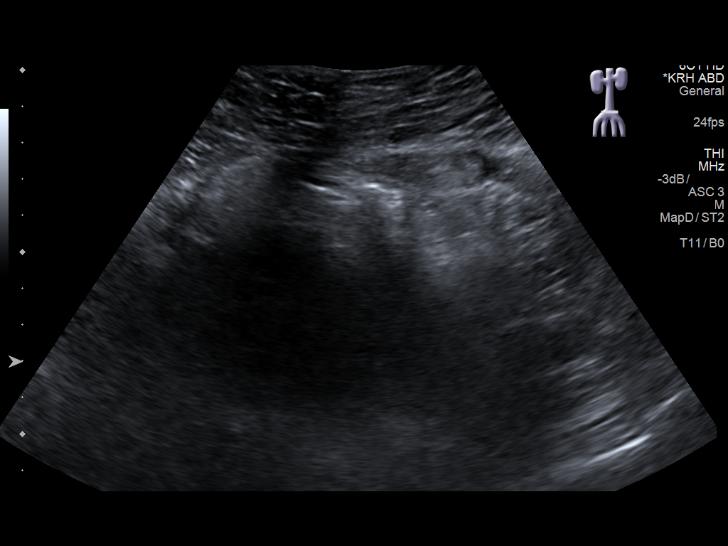

[13 of 25 positions shown; findings below may reference images not displayed]

FINDINGS: Gallbladder: Normal sonographic appearance of the gallbladder. No
gallbladder wall thickening or pericholecystic fluid. Negative
sonographic Murphy's sign.

Common bile duct: Diameter: Normal in size measuring 2 mm in
diameter

Liver: Normal sonographic appearance of the liver. No discrete
hepatic lesions. No intra or extrahepatic biliary dilatation. No
ascites. Portal vein is patent on color Doppler imaging with normal
direction of blood flow towards the liver.

IVC: No abnormality visualized.

Pancreas: Limited visualization of the pancreatic head and neck is
normal. Visualization of the pancreatic body and tail is obscured by
bowel gas.

Spleen: Normal in size measuring 6.7 cm in length

Right Kidney: Normal cortical thickness, echogenicity and size,
measuring 10.6 cm in length. No focal renal lesions. No echogenic
renal stones. No urinary obstruction.

Left Kidney: Normal cortical thickness, echogenicity and size,
measuring 10.9 cm in length. No focal renal lesions. No echogenic
renal stones. No urinary obstruction.

Abdominal aorta: No aneurysm visualized.

Other findings: None.
IMPRESSION: No explanation patient's left upper quadrant abdominal pain and
vomiting. Specifically, no evidence of cholelithiasis/cholecystitis
or urinary obstruction.

## 2018-08-05 DIAGNOSIS — F4320 Adjustment disorder, unspecified: Secondary | ICD-10-CM | POA: Diagnosis not present

## 2018-08-26 DIAGNOSIS — F4320 Adjustment disorder, unspecified: Secondary | ICD-10-CM | POA: Diagnosis not present

## 2018-09-09 DIAGNOSIS — F4320 Adjustment disorder, unspecified: Secondary | ICD-10-CM | POA: Diagnosis not present

## 2018-09-24 DIAGNOSIS — R05 Cough: Secondary | ICD-10-CM | POA: Diagnosis not present

## 2018-09-25 DIAGNOSIS — F4320 Adjustment disorder, unspecified: Secondary | ICD-10-CM | POA: Diagnosis not present

## 2018-10-23 DIAGNOSIS — F4320 Adjustment disorder, unspecified: Secondary | ICD-10-CM | POA: Diagnosis not present

## 2018-11-14 DIAGNOSIS — F4320 Adjustment disorder, unspecified: Secondary | ICD-10-CM | POA: Diagnosis not present

## 2018-12-16 DIAGNOSIS — F4320 Adjustment disorder, unspecified: Secondary | ICD-10-CM | POA: Diagnosis not present

## 2019-01-01 DIAGNOSIS — D132 Benign neoplasm of duodenum: Secondary | ICD-10-CM | POA: Diagnosis not present

## 2019-01-01 DIAGNOSIS — D128 Benign neoplasm of rectum: Secondary | ICD-10-CM | POA: Diagnosis not present

## 2019-01-01 DIAGNOSIS — K317 Polyp of stomach and duodenum: Secondary | ICD-10-CM | POA: Diagnosis not present

## 2019-01-01 DIAGNOSIS — D126 Benign neoplasm of colon, unspecified: Secondary | ICD-10-CM | POA: Diagnosis not present

## 2019-01-01 DIAGNOSIS — D131 Benign neoplasm of stomach: Secondary | ICD-10-CM | POA: Diagnosis not present

## 2019-01-20 DIAGNOSIS — F4320 Adjustment disorder, unspecified: Secondary | ICD-10-CM | POA: Diagnosis not present

## 2019-02-10 DIAGNOSIS — F4320 Adjustment disorder, unspecified: Secondary | ICD-10-CM | POA: Diagnosis not present

## 2019-03-11 DIAGNOSIS — F4320 Adjustment disorder, unspecified: Secondary | ICD-10-CM | POA: Diagnosis not present

## 2019-05-20 DIAGNOSIS — R1010 Upper abdominal pain, unspecified: Secondary | ICD-10-CM | POA: Diagnosis not present

## 2019-05-20 DIAGNOSIS — R110 Nausea: Secondary | ICD-10-CM | POA: Diagnosis not present

## 2019-05-21 DIAGNOSIS — F4320 Adjustment disorder, unspecified: Secondary | ICD-10-CM | POA: Diagnosis not present

## 2019-05-21 DIAGNOSIS — R1010 Upper abdominal pain, unspecified: Secondary | ICD-10-CM | POA: Diagnosis not present

## 2019-05-21 DIAGNOSIS — R110 Nausea: Secondary | ICD-10-CM | POA: Diagnosis not present

## 2019-06-06 DIAGNOSIS — R1012 Left upper quadrant pain: Secondary | ICD-10-CM | POA: Diagnosis not present

## 2019-06-06 DIAGNOSIS — R1032 Left lower quadrant pain: Secondary | ICD-10-CM | POA: Diagnosis not present

## 2019-06-06 DIAGNOSIS — Z880 Allergy status to penicillin: Secondary | ICD-10-CM | POA: Diagnosis not present

## 2019-06-06 DIAGNOSIS — R112 Nausea with vomiting, unspecified: Secondary | ICD-10-CM | POA: Diagnosis not present

## 2019-06-06 DIAGNOSIS — K56600 Partial intestinal obstruction, unspecified as to cause: Secondary | ICD-10-CM | POA: Diagnosis not present

## 2019-06-07 DIAGNOSIS — R112 Nausea with vomiting, unspecified: Secondary | ICD-10-CM | POA: Diagnosis not present

## 2019-06-07 DIAGNOSIS — K56600 Partial intestinal obstruction, unspecified as to cause: Secondary | ICD-10-CM | POA: Diagnosis not present

## 2019-06-07 DIAGNOSIS — R109 Unspecified abdominal pain: Secondary | ICD-10-CM | POA: Diagnosis not present

## 2019-06-08 DIAGNOSIS — K56600 Partial intestinal obstruction, unspecified as to cause: Secondary | ICD-10-CM | POA: Diagnosis not present

## 2019-06-08 DIAGNOSIS — R112 Nausea with vomiting, unspecified: Secondary | ICD-10-CM | POA: Diagnosis not present

## 2019-06-09 DIAGNOSIS — R112 Nausea with vomiting, unspecified: Secondary | ICD-10-CM | POA: Diagnosis not present

## 2019-06-09 DIAGNOSIS — K56600 Partial intestinal obstruction, unspecified as to cause: Secondary | ICD-10-CM | POA: Diagnosis not present

## 2019-06-11 DIAGNOSIS — Z309 Encounter for contraceptive management, unspecified: Secondary | ICD-10-CM | POA: Diagnosis not present

## 2019-06-11 DIAGNOSIS — Z3202 Encounter for pregnancy test, result negative: Secondary | ICD-10-CM | POA: Diagnosis not present

## 2019-06-11 DIAGNOSIS — Z30011 Encounter for initial prescription of contraceptive pills: Secondary | ICD-10-CM | POA: Diagnosis not present

## 2019-07-02 DIAGNOSIS — F4320 Adjustment disorder, unspecified: Secondary | ICD-10-CM | POA: Diagnosis not present

## 2019-07-27 DIAGNOSIS — F4320 Adjustment disorder, unspecified: Secondary | ICD-10-CM | POA: Diagnosis not present

## 2019-08-11 DIAGNOSIS — F4320 Adjustment disorder, unspecified: Secondary | ICD-10-CM | POA: Diagnosis not present

## 2019-09-16 DIAGNOSIS — F4320 Adjustment disorder, unspecified: Secondary | ICD-10-CM | POA: Diagnosis not present
